# Patient Record
Sex: Female | Born: 1995 | Race: Black or African American | Hispanic: No | Marital: Single | Smoking: Former smoker
Health system: Southern US, Community
[De-identification: ages and names within clinical notes are randomized; demographics above are authoritative.]

## PROBLEM LIST (undated history)

## (undated) DIAGNOSIS — R4789 Other speech disturbances: Secondary | ICD-10-CM

## (undated) DIAGNOSIS — N390 Urinary tract infection, site not specified: Secondary | ICD-10-CM

## (undated) DIAGNOSIS — R519 Headache, unspecified: Secondary | ICD-10-CM

## (undated) HISTORY — DX: Urinary tract infection, site not specified: N39.0

## (undated) HISTORY — DX: Other speech disturbances: R47.89

## (undated) HISTORY — DX: Headache, unspecified: R51.9

---

## 2020-02-29 ENCOUNTER — Emergency Department (HOSPITAL_COMMUNITY)
Admission: EM | Admit: 2020-02-29 | Discharge: 2020-02-29 | Disposition: A | Discharge status: Home / Self Care | Payer: PRIVATE HEALTH INSURANCE | Attending: Emergency Medicine | Admitting: Emergency Medicine

## 2020-02-29 ENCOUNTER — Encounter (HOSPITAL_COMMUNITY): Payer: Self-pay

## 2020-02-29 ENCOUNTER — Other Ambulatory Visit: Payer: Self-pay

## 2020-02-29 ENCOUNTER — Emergency Department (HOSPITAL_COMMUNITY): Payer: PRIVATE HEALTH INSURANCE

## 2020-02-29 DIAGNOSIS — G8929 Other chronic pain: Secondary | ICD-10-CM | POA: Diagnosis not present

## 2020-02-29 DIAGNOSIS — R4701 Aphasia: Secondary | ICD-10-CM | POA: Diagnosis not present

## 2020-02-29 DIAGNOSIS — H539 Unspecified visual disturbance: Secondary | ICD-10-CM | POA: Diagnosis not present

## 2020-02-29 DIAGNOSIS — R479 Unspecified speech disturbances: Secondary | ICD-10-CM | POA: Diagnosis not present

## 2020-02-29 DIAGNOSIS — H9193 Unspecified hearing loss, bilateral: Secondary | ICD-10-CM | POA: Insufficient documentation

## 2020-02-29 DIAGNOSIS — R519 Headache, unspecified: Secondary | ICD-10-CM | POA: Diagnosis not present

## 2020-02-29 LAB — CBC
HCT: 39.7 % (ref 36.0–46.0)
Hemoglobin: 13.3 g/dL (ref 12.0–15.0)
MCH: 30.2 pg (ref 26.0–34.0)
MCHC: 33.5 g/dL (ref 30.0–36.0)
MCV: 90 fL (ref 80.0–100.0)
Platelets: 251 10*3/uL (ref 150–400)
RBC: 4.41 MIL/uL (ref 3.87–5.11)
RDW: 12.6 % (ref 11.5–15.5)
WBC: 12.2 10*3/uL — ABNORMAL HIGH (ref 4.0–10.5)
nRBC: 0 % (ref 0.0–0.2)

## 2020-02-29 LAB — BASIC METABOLIC PANEL
Anion gap: 10 (ref 5–15)
BUN: 10 mg/dL (ref 6–20)
CO2: 22 mmol/L (ref 22–32)
Calcium: 9 mg/dL (ref 8.9–10.3)
Chloride: 105 mmol/L (ref 98–111)
Creatinine, Ser: 0.86 mg/dL (ref 0.44–1.00)
GFR, Estimated: >60 mL/min (ref >60)
Glucose, Bld: 84 mg/dL (ref 70–99)
Potassium: 4 mmol/L (ref 3.5–5.1)
Sodium: 137 mmol/L (ref 135–145)

## 2020-02-29 LAB — I-STAT BETA HCG BLOOD, ED (MC, WL, AP ONLY): I-stat hCG, quantitative: <5 m[IU]/mL (ref <5)

## 2020-02-29 NOTE — ED Provider Notes (Signed)
[facility] EMERGENCY DEPARTMENT Provider Note   CSN: [id number] Arrival date & time: 02/29/20  1257     History Chief Complaint  Patient presents with  . Headache    [facility] Mary Sutton is a 24 y.o. female.  Patient with no pertinent past medical history, recently moved from [place] presents to the ED today for right sided HA and memory issues. She states that this began August 2020 and gotten worse recently, prompting ED visit today. She describes the HA pain as throbbing located primarily on the right side with it focused behind the right eye and moving towards the back of her head. She denies any lacrimation or rhinorrhea. She also states that she experiences "aphasia" which is described as being able to understand language and what she wants to say but has trouble word finding and expressing herself.  This has affected her ability to work.  She also states that she has noticed some grip weakness on her right side and also worsening vision changes and hearing loss on both sides. She did have a fall on Saturday in which she felt dizzy after working a long shift. She denies injury.  No treatments prior to arrival.        History reviewed. No pertinent past medical history.  There are no problems to display for this patient.   History reviewed. No pertinent surgical history.   OB History   No obstetric history on file.     History reviewed. No pertinent family history.  Social History   Tobacco Use  . Smoking status: Not on file  Substance Use Topics  . Alcohol use: Not on file  . Drug use: Not on file    Home Medications Prior to Admission medications   Not on File    Allergies    Patient has no allergy information on record.  Review of Systems   Review of Systems  Constitutional: Negative for fever.  HENT: Negative for congestion, dental problem, rhinorrhea and sinus pressure.   Eyes: Positive for visual disturbance (Blurry vision, no  vision loss). Negative for photophobia, discharge and redness.  Respiratory: Negative for shortness of breath.   Cardiovascular: Negative for chest pain.  Gastrointestinal: Negative for nausea and vomiting.  Musculoskeletal: Negative for gait problem, neck pain and neck stiffness.  Skin: Negative for rash.  Neurological: Positive for dizziness, speech difficulty, weakness, light-headedness and headaches. Negative for syncope and numbness.  Psychiatric/Behavioral: Negative for confusion.    Physical Exam Updated Vital Signs BP 116/69 (BP Location: Left Arm)   Pulse 76   Temp 98.4 F (36.9 C) (Oral)   Resp 18   Ht 5\' 5"  (1.651 m)   Wt 90.7 kg   LMP 01/31/2020 (Approximate)   SpO2 100%   BMI 33.28 kg/m   Physical Exam Vitals and nursing note reviewed.  Constitutional:      Appearance: She is well-developed.  HENT:     Head: Normocephalic and atraumatic.     Right Ear: Tympanic membrane, ear canal and external ear normal.     Left Ear: Tympanic membrane, ear canal and external ear normal.     Nose: Nose normal.     Mouth/Throat:     Pharynx: Uvula midline.  Eyes:     General: Lids are normal.     Extraocular Movements:     Right eye: No nystagmus.     Left eye: No nystagmus.     Conjunctiva/sclera: Conjunctivae normal.     Pupils:  Pupils are equal, round, and reactive to light.  Cardiovascular:     Rate and Rhythm: Normal rate and regular rhythm.  Pulmonary:     Effort: Pulmonary effort is normal.     Breath sounds: Normal breath sounds.  Abdominal:     Palpations: Abdomen is soft.     Tenderness: There is no abdominal tenderness.  Musculoskeletal:     Cervical back: Normal range of motion and neck supple. No tenderness or bony tenderness.  Skin:    General: Skin is warm and dry.  Neurological:     Mental Status: She is alert and oriented to person, place, and time.     GCS: GCS eye subscore is 4. GCS verbal subscore is 5. GCS motor subscore is 6.     Cranial  Nerves: No cranial nerve deficit or dysarthria.     Sensory: No sensory deficit.     Motor: Motor function is intact. No weakness.     Coordination: Coordination normal.     Gait: Gait normal.     Comments: 5/5 strength of the fingers, wrist, elbows, and shoulders bilaterally.  Able to talk in full sentences without any apparent word finding difficulties or trouble with her speech.     ED Results / Procedures / Treatments   Labs (all labs ordered are listed, but only abnormal results are displayed) Labs Reviewed  CBC - Abnormal; Notable for the following components:      Result Value   WBC 12.2 (*)    All other components within normal limits  BASIC METABOLIC PANEL  I-STAT BETA HCG BLOOD, ED ([facility], [facility], [facility] ONLY)    EKG None  Radiology CT Head Wo Contrast  Result Date: 02/29/2020 CLINICAL DATA:  Headache, new or worsening. EXAM: CT HEAD WITHOUT CONTRAST TECHNIQUE: Contiguous axial images were obtained from the base of the skull through the vertex without intravenous contrast. COMPARISON:  None. FINDINGS: Brain: No evidence of acute large vascular territory infarction, hemorrhage, hydrocephalus, extra-axial collection or mass lesion/mass effect. Vascular: No hyperdense vessel or unexpected calcification. Skull: Normal. Negative for fracture or focal lesion. Sinuses/Orbits: Opacification of a few left ethmoid air cells. Unremarkable orbits. Other: No mastoid effusions. IMPRESSION: No evidence of acute intracranial abnormality. Electronically Signed   By: Feliberto Harts MD   On: 02/29/2020 17:34    Procedures Procedures (including critical care time)  Medications Ordered in ED Medications - No data to display  ED Course  I have reviewed the triage vital signs and the nursing notes.  Pertinent labs & imaging results that were available during my care of the patient were reviewed by me and considered in my medical decision making (see chart for details).  Patient seen and  examined.  Lab work ordered by nursing protocol reviewed and unremarkable.  Given neurologic complaints, will obtain CT imaging of the head.  Vital signs reviewed and are as follows: BP 123/71 (BP Location: Left Arm)   Pulse 67   Temp 98.4 F (36.9 C) (Oral)   Resp 18   Ht 5\' 5"  (1.651 m)   Wt 90.7 kg   LMP 01/31/2020 (Approximate)   SpO2 98%   BMI 33.28 kg/m   6:11 PM patient updated on CT results.  No indication for further work-up in the emergency department, however patient is agreeable to follow-up as an outpatient with neurology.  Will place ambulatory referral.  Will also give referral for PCP as she just moved to [facility].  Patient counseled to return if  they have worsening symptoms including weakness in their arms or legs, slurred speech, trouble walking or talking, confusion, trouble with their balance, or if they have any other concerns. Patient verbalizes understanding and agrees with plan.      MDM Rules/Calculators/A&P                          Patient with chronic right-sided headache over the past 1 to 2 years with reported memory difficulties.  Also reports weakness in her right arm, vision and hearing changes.  Fortunately, tonight her exam is reassuring without any gross objective findings.  Patient without high-risk features of headache including: sudden onset/thunderclap HA, no similar headache in past, altered mental status, accompanying seizure, headache with exertion, age > 58, history of immunocompromise, neck or shoulder pain, fever, use of anticoagulation, family history of spontaneous SAH, concomitant drug use, toxic exposure.   Patient has a normal complete neurological exam, normal vital signs, normal level of consciousness, no signs of meningismus, is well-appearing/non-toxic appearing, no signs of trauma.   CT imaging of the head performed without any acute findings.  I do not feel that the patient requires MRI or other testing here in the emergency  department tonight, however she would benefit from outpatient neurology follow-up.  Referral placed.  No dangerous or life-threatening conditions suspected or identified by history, physical exam, and by work-up. No indications for hospitalization identified.   Final Clinical Impression(s) / ED Diagnoses Final diagnoses:  Chronic nonintractable headache, unspecified headache type  Difficulty with speech    Rx / DC Orders ED Discharge Orders         Ordered    Ambulatory referral to Neurology       Comments: An appointment is requested in approximately: 1 week Headaches, memory difficulty, subjective right arm weakness   02/29/20 1801           Renne Crigler, PA-C 02/29/20 1813    Sabino Donovan, MD 02/29/20 2219

## 2020-02-29 NOTE — Medical Student Note (Signed)
[facility]-EMERGENCY DEPT Provider Student Note For educational purposes for Medical, PA and NP students only and not part of the legal medical record.   CSN: [id number] Arrival date & time: 02/29/20  1257      History   Chief Complaint Chief Complaint  Patient presents with  . Headache    HPI Mary Sutton is a 24 y.o. female.  The history is provided by the patient.  Headache Pain location:  Frontal, R parietal, R temporal and occipital Associated symptoms: blurred vision, eye pain, focal weakness (right hand) and hearing loss   Associated symptoms: no abdominal pain, no diarrhea, no dizziness, no ear pain and no fever     Patient presents to the ED today for right sided HA and memory issues. She states that this all began August 2020 and gotten worse recently. She describes the HA pain as throbbing located primarily on the right sides with it focused behind the right head and moving towards the back of her head. She denies any lacrimation or rhinorrhea. She also states that she experiences aphasia which is described as expressive as she is able to understand language and what she wants to say but has trouble word finding and expressing herself. She also states that she has noticed some grip weakness on her right side and also worsening vision changes and hearing loss on both sides. She did have a fall on Saturday in which she felt dizzy after working a long shift. She denies and injury.   History reviewed. No pertinent past medical history.  There are no problems to display for this patient.   History reviewed. No pertinent surgical history.  OB History   No obstetric history on file.      Home Medications    Prior to Admission medications   Not on File    Family History History reviewed. No pertinent family history.  Social History Social History   Tobacco Use  . Smoking status: Not on file  Substance Use Topics  . Alcohol use: Not on file  . Drug use:  Not on file     Allergies   Patient has no allergy information on record.   Review of Systems Review of Systems  Constitutional: Negative for fever.  HENT: Positive for hearing loss. Negative for ear pain.   Eyes: Positive for blurred vision and pain.  Respiratory: Negative.   Cardiovascular: Negative.   Gastrointestinal: Negative for abdominal pain and diarrhea.  Endocrine: Negative.   Genitourinary: Negative.   Musculoskeletal: Negative.   Skin: Negative.   Allergic/Immunologic: Negative.   Neurological: Positive for focal weakness (right hand) and headaches. Negative for dizziness.  Hematological: Negative.   Psychiatric/Behavioral: Negative for suicidal ideas.     Physical Exam Updated Vital Signs BP 116/69 (BP Location: Left Arm)   Pulse 76   Temp 98.4 F (36.9 C) (Oral)   Resp 18   Ht 5\' 5"  (1.651 m)   Wt 90.7 kg   LMP 01/31/2020 (Approximate)   SpO2 100%   BMI 33.28 kg/m   Physical Exam Constitutional:      Appearance: She is well-developed.  HENT:     Head: Normocephalic and atraumatic.      Mouth/Throat:     Mouth: Mucous membranes are moist.     Pharynx: Oropharynx is clear.  Eyes:     Extraocular Movements: Extraocular movements intact.     Pupils: Pupils are equal, round, and reactive to light.  Pulmonary:     Effort:  Pulmonary effort is normal.     Breath sounds: Normal breath sounds.  Musculoskeletal:     Cervical back: Normal range of motion and neck supple.  Neurological:     Mental Status: She is alert.    Physical exam was normal. She demonstrated a normal neuro exam. She had normal strength and sensation to both upper and lower extremities bilaterally.  ED Treatments / Results  Labs (all labs ordered are listed, but only abnormal results are displayed) Labs Reviewed  CBC - Abnormal; Notable for the following components:      Result Value   WBC 12.2 (*)    All other components within normal limits  BASIC METABOLIC PANEL    I-STAT BETA HCG BLOOD, ED ([facility], [facility], [facility] ONLY)    EKG  Radiology Non contrast head CT was ordered and the radiologist read it as normal  Procedures Procedures (including critical care time)  Medications Ordered in ED Medications - No data to display   Initial Impression / Assessment and Plan / ED Course  I have reviewed the triage vital signs and the nursing notes.  Pertinent labs & imaging results that were available during my care of the patient were reviewed by me and considered in my medical decision making (see chart for details).     Based on the results of the CT, lab results, and physical exam I think that this patient can be discharged today with plans of having her follow up with a neurologist. She will be given NSAIDs for pain from her headache. She has been instructed to come back if she develops any acute changes such as worsening HA, weakness or numbness, changes or vision.   Final Clinical Impressions(s) / ED Diagnoses     New Prescriptions New Prescriptions   No medications on file

## 2020-02-29 NOTE — ED Triage Notes (Signed)
Pt to ED c/o headache over the past year, Reports she has been having issues with short term and long term memory at her job. Reports sensitivity to right eye. Reports the pain is constant and is pressure. No medical history. Reports takes ibprofen with no relief. Pt crying in triage, no speech clear.

## 2020-02-29 NOTE — Discharge Instructions (Signed)
Please read and follow all provided instructions.  Your diagnoses today include:  1. Chronic nonintractable headache, unspecified headache type   2. Difficulty with speech     Tests performed today include:  CT of your head which was normal and did not show any serious cause of your headache  Vital signs. See below for your results today.   Medications:   None  Take any prescribed medications only as directed.  Additional information:  Follow any educational materials contained in this packet.  You are having a headache. No specific cause was found today for your headache. It may have been a migraine or other cause of headache. Stress, anxiety, fatigue, and depression are common triggers for headaches.   Your headache today does not appear to be life-threatening or require hospitalization, but often the exact cause of headaches is not determined in the emergency department. Therefore, follow-up with your doctor is very important to find out what may have caused your headache and whether or not you need any further diagnostic testing or treatment.   Sometimes headaches can appear benign (not harmful), but then more serious symptoms can develop which should prompt an immediate re-evaluation by your doctor or the emergency department.  BE VERY CAREFUL not to take multiple medicines containing Tylenol (also called acetaminophen). Doing so can lead to an overdose which can damage your liver and cause liver failure and possibly death.    Follow-up instructions: Please follow-up with the neurologist listed for further evaluation of your symptoms.   Return instructions:   Please return to the Emergency Department if you experience worsening symptoms.  Return if the medications do not resolve your headache, if it recurs, or if you have multiple episodes of vomiting or cannot keep down fluids.  Return if you have a change from the usual headache.  RETURN IMMEDIATELY IF you:  Develop  a sudden, severe headache  Develop confusion or become poorly responsive or faint  Develop a fever above 100.43F or problem breathing  Have a change in speech, vision, swallowing, or understanding  Develop new weakness, numbness, tingling, incoordination in your arms or legs  Have a seizure  Please return if you have any other emergent concerns.  Additional Information:  Your vital signs today were: BP 116/69 (BP Location: Left Arm)   Pulse 76   Temp 98.4 F (36.9 C) (Oral)   Resp 18   Ht 5\' 5"  (1.651 m)   Wt 90.7 kg   LMP 01/31/2020 (Approximate)   SpO2 100%   BMI 33.28 kg/m  If your blood pressure (BP) was elevated above 135/85 this visit, please have this repeated by your doctor within one month. --------------

## 2020-02-29 NOTE — ED Notes (Signed)
Patient transported to CT 

## 2020-06-19 ENCOUNTER — Encounter: Payer: Self-pay | Admitting: *Deleted

## 2020-06-20 ENCOUNTER — Ambulatory Visit (INDEPENDENT_AMBULATORY_CARE_PROVIDER_SITE_OTHER): Payer: 59 | Admitting: Diagnostic Neuroimaging

## 2020-06-20 ENCOUNTER — Encounter: Payer: Self-pay | Admitting: Diagnostic Neuroimaging

## 2020-06-20 ENCOUNTER — Other Ambulatory Visit: Payer: Self-pay

## 2020-06-20 VITALS — BP 128/80 | HR 88 | Ht 65.0 in | Wt 195.0 lb

## 2020-06-20 DIAGNOSIS — R413 Other amnesia: Secondary | ICD-10-CM | POA: Diagnosis not present

## 2020-06-20 DIAGNOSIS — R519 Headache, unspecified: Secondary | ICD-10-CM

## 2020-06-20 NOTE — Patient Instructions (Signed)
  RIGHT SIDED HEADACHES / MEMORY LOSS - check MRI brain - daily physical activity / exercise (at least 15-30 minutes) - eat more plants / vegetables - increase social activities, brain stimulation, games, puzzles, hobbies, crafts, arts, music - aim for at least 7-8 hours sleep per night (or more)   H/O MIGRAINE WITH AURA - consider amitriptyline, topiramate, triptans

## 2020-06-20 NOTE — Progress Notes (Signed)
[facility]  PATIENT: Mary Sutton DOB: 08-Nov-1995  REFERRING CLINICIAN: Renne Crigler, PA-C HISTORY FROM: patient  REASON FOR VISIT: new consult    HISTORICAL  CHIEF COMPLAINT:  Chief Complaint  Patient presents with  . Headache    Rm 7 New Pt, ED referral "migraines since I was younger, got better but have come back, past 2 weeks throbbing, headaches across my forehead w/short term memory loss/stuttering, can't remember simple words; Ibuprofen can help after several doses, used to alternate Excedrin and Ibuprofen"     HISTORY OF PRESENT ILLNESS:   25 year old female here for evaluation of headaches and memory loss.  Patient reports history of migraine headaches from age 51 to 31 years old with right-sided headaches, nausea, controlled with over-the-counter medications.  Headache spontaneously resolved.  About 2 years ago she had onset of mixing up her letters and words, memory loss, confusion, right-sided headaches, on a daily basis.  She has constant nausea.  No sensitive light or sound.  She has had significant increase stress lately, was tearful in the exam room but would not elaborate further.  She was previously living in [place], but moved to [place] in July 2021 and currently lives with her best friend.  She works 2 jobs, 8:30a-4:30p and 5:00p-11:00p.  She states she has been working this type of schedule since age 43 years old.  Due to severity of symptoms patient went to the emergency room recently for evaluation.  CT of the head was unremarkable.  Patient was given migraine cocktail medications and discharged.     REVIEW OF SYSTEMS: Full 14 system review of systems performed and negative with exception of: As per HPI.  ALLERGIES: No Known Allergies  HOME MEDICATIONS: No outpatient medications prior to visit.   No facility-administered medications prior to visit.    PAST MEDICAL HISTORY: Past Medical History:  Diagnosis Date   . Chronic UTI   . Headache   . Word finding difficulty     PAST SURGICAL HISTORY: No past surgical history on file.  FAMILY HISTORY: No family history on file.  SOCIAL HISTORY: Social History   Socioeconomic History  . Marital status: Single    Spouse name: Not on file  . Number of children: 0  . Years of education: Not on file  . Highest education level: Some college, no degree  Occupational History    Comment: customer service, work 2 jobs  Tobacco Use  . Smoking status: Former Smoker    Quit date: 06/21/2018    Years since quitting: 2.0  . Smokeless tobacco: Never Used  . Tobacco comment: 1 cig a day, socially  Substance and Sexual Activity  . Alcohol use: Yes    Comment: socially  . 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 Sexual activity: Not on file  Other Topics Concern  . Not on file  Social History Narrative   Caffeine- none   Social Determinants of Health   Financial Resource Strain: Not on file  Food Insecurity: Not on file  Transportation Needs: Not on file  Physical Activity: Not on file  Stress: Not on file  Social Connections: Not on file  Intimate Partner Violence: Not on file     PHYSICAL EXAM  GENERAL EXAM/CONSTITUTIONAL: Vitals:  Vitals:   06/20/20 1541  BP: 128/80  Pulse: 88  Weight: 195 lb (88.5 kg)  Height: 5\' 5"  (1.651 m)   Body mass index is  32.45 kg/m. Wt Readings from Last 3 Encounters:  06/20/20 195 lb (88.5 kg)  02/29/20 200 lb (90.7 kg)    Patient is in no distress; well developed, nourished and groomed; neck is supple  CARDIOVASCULAR:  Examination of carotid arteries is normal; no carotid bruits  Regular rate and rhythm, no murmurs  Examination of peripheral vascular system by observation and palpation is normal  EYES:  Ophthalmoscopic exam of optic discs and posterior segments is normal; no papilledema or hemorrhages No exam data present  MUSCULOSKELETAL:  Gait, strength, tone,  movements noted in Neurologic exam below  NEUROLOGIC: MENTAL STATUS:  No flowsheet data found.  awake, alert, oriented to person, place and time  recent and remote memory intact  normal attention and concentration  language fluent, comprehension intact, naming intact  fund of knowledge appropriate  CRANIAL NERVE:   2nd - no papilledema on fundoscopic exam  2nd, 3rd, 4th, 6th - pupils equal and reactive to light, visual fields full to confrontation, extraocular muscles intact, no nystagmus  5th - facial sensation symmetric  7th - facial strength symmetric  8th - hearing intact  9th - palate elevates symmetrically, uvula midline  11th - shoulder shrug symmetric  12th - tongue protrusion midline  MOTOR:   normal bulk and tone, full strength in the BUE, BLE  SENSORY:   normal and symmetric to light touch, temperature, vibration  COORDINATION:   finger-nose-finger, fine finger movements normal  REFLEXES:   deep tendon reflexes present and symmetric  GAIT/STATION:   narrow based gait     DIAGNOSTIC DATA (LABS, IMAGING, TESTING) - I reviewed patient records, labs, notes, testing and imaging myself where available.  Lab Results  Component Value Date   WBC 12.2 (H) 02/29/2020   HGB 13.3 02/29/2020   HCT 39.7 02/29/2020   MCV 90.0 02/29/2020   PLT 251 02/29/2020      Component Value Date/Time   NA 137 02/29/2020 1327   K 4.0 02/29/2020 1327   CL 105 02/29/2020 1327   CO2 22 02/29/2020 1327   GLUCOSE 84 02/29/2020 1327   BUN 10 02/29/2020 1327   CREATININE 0.86 02/29/2020 1327   CALCIUM 9.0 02/29/2020 1327   GFRNONAA >60 02/29/2020 1327   No results found for: CHOL, HDL, LDLCALC, LDLDIRECT, TRIG, CHOLHDL No results found for: UEKC0K No results found for: VITAMINB12 No results found for: TSH  02/29/20 CT head [I reviewed images myself and agree with interpretation. -VRP]  - negative   ASSESSMENT AND PLAN  25 y.o. year old female here  with:  Dx:  1. Right sided temporal headache   2. Memory loss      PLAN:  RIGHT SIDED HEADACHES / MEMORY LOSS - check MRI brain - daily physical activity / exercise (at least 15-30 minutes) - eat more plants / vegetables - increase social activities, brain stimulation, games, puzzles, hobbies, crafts, arts, music - aim for at least 7-8 hours sleep per night (or more)  H/O MIGRAINE WITH AURA - consider amitriptyline, topiramate, triptans  Orders Placed This Encounter  Procedures  . MR BRAIN W WO CONTRAST   Return for pending if symptoms worsen or fail to improve.    Suanne Marker, MD 06/20/2020, 4:15 PM Certified in Neurology, Neurophysiology and Neuroimaging  [facility] [address] [place], [place] [address] [phone]

## 2020-06-21 ENCOUNTER — Telehealth: Payer: Self-pay | Admitting: Diagnostic Neuroimaging

## 2020-06-21 NOTE — Telephone Encounter (Signed)
[employer] pending faxed notes 

## 2020-06-26 ENCOUNTER — Telehealth: Payer: Self-pay | Admitting: Diagnostic Neuroimaging

## 2020-06-26 NOTE — Telephone Encounter (Signed)
Pt. states that her insurance approved PA for MRI & she wants to know when she will be scheduled. Please advise.

## 2020-06-26 NOTE — Telephone Encounter (Signed)
Called patient and informed her she can call [facility] to schedule MRI, gave her their #. Patient verbalized understanding, appreciation.

## 2020-06-26 NOTE — Telephone Encounter (Signed)
[employer] Berkley Harvey: [id number] (exp. 06/25/20 to 08/09/20) order sent to GI. They will reach out to the patient to schedule.

## 2020-07-06 ENCOUNTER — Ambulatory Visit
Admission: RE | Admit: 2020-07-06 | Discharge: 2020-07-06 | Disposition: A | Discharge status: Home / Self Care | Payer: 59 | Source: Ambulatory Visit | Attending: Diagnostic Neuroimaging | Admitting: Diagnostic Neuroimaging

## 2020-07-06 ENCOUNTER — Other Ambulatory Visit: Payer: Self-pay

## 2020-07-06 DIAGNOSIS — R519 Headache, unspecified: Secondary | ICD-10-CM

## 2020-07-06 DIAGNOSIS — R413 Other amnesia: Secondary | ICD-10-CM

## 2020-07-06 MED ORDER — GADOBENATE DIMEGLUMINE 529 MG/ML IV SOLN
18.0000 mL | Freq: Once | INTRAVENOUS | Status: AC | PRN
  Administered 2020-07-06: 8 mL via INTRAVENOUS

## 2020-07-10 ENCOUNTER — Telehealth: Payer: Self-pay

## 2020-07-10 ENCOUNTER — Other Ambulatory Visit: Payer: Self-pay | Admitting: Physician Assistant

## 2020-07-10 DIAGNOSIS — R1031 Right lower quadrant pain: Secondary | ICD-10-CM

## 2020-07-10 NOTE — Telephone Encounter (Signed)
Mary Marker, MD  07/06/2020 4:58 PM EDT Back to Top     Unremarkable imaging results. Please call patient. Continue current plan. -VRP    I called pt and we reviewed results. Pt verbalized understanding and requested to f/u in 3 months. Appt scheduled for June 27 th at 430 pm

## 2020-07-17 ENCOUNTER — Other Ambulatory Visit: Payer: Self-pay

## 2020-07-25 ENCOUNTER — Other Ambulatory Visit: Payer: 59

## 2020-09-20 ENCOUNTER — Encounter (HOSPITAL_COMMUNITY): Payer: Self-pay | Admitting: Registered Nurse

## 2020-09-20 ENCOUNTER — Ambulatory Visit (HOSPITAL_COMMUNITY)
Admission: EM | Admit: 2020-09-20 | Discharge: 2020-09-21 | Disposition: A | Discharge status: Home / Self Care | Payer: No Payment, Other | Attending: Psychiatry | Admitting: Psychiatry

## 2020-09-20 ENCOUNTER — Other Ambulatory Visit: Payer: Self-pay

## 2020-09-20 DIAGNOSIS — Z20822 Contact with and (suspected) exposure to covid-19: Secondary | ICD-10-CM | POA: Diagnosis not present

## 2020-09-20 DIAGNOSIS — F411 Generalized anxiety disorder: Secondary | ICD-10-CM | POA: Diagnosis not present

## 2020-09-20 DIAGNOSIS — F1290 Cannabis use, unspecified, uncomplicated: Secondary | ICD-10-CM | POA: Insufficient documentation

## 2020-09-20 DIAGNOSIS — Z79899 Other long term (current) drug therapy: Secondary | ICD-10-CM | POA: Insufficient documentation

## 2020-09-20 DIAGNOSIS — F17210 Nicotine dependence, cigarettes, uncomplicated: Secondary | ICD-10-CM | POA: Insufficient documentation

## 2020-09-20 DIAGNOSIS — F332 Major depressive disorder, recurrent severe without psychotic features: Secondary | ICD-10-CM | POA: Insufficient documentation

## 2020-09-20 DIAGNOSIS — R45851 Suicidal ideations: Secondary | ICD-10-CM | POA: Diagnosis not present

## 2020-09-20 LAB — POCT URINE DRUG SCREEN - MANUAL ENTRY (I-SCREEN)
POC Amphetamine UR: NOT DETECTED
POC Buprenorphine (BUP): NOT DETECTED
POC Cocaine UR: NOT DETECTED
POC Marijuana UR: POSITIVE — AB
POC Methadone UR: NOT DETECTED
POC Methamphetamine UR: NOT DETECTED
POC Morphine: NOT DETECTED
POC Oxazepam (BZO): NOT DETECTED
POC Oxycodone UR: NOT DETECTED
POC Secobarbital (BAR): NOT DETECTED

## 2020-09-20 LAB — RESP PANEL BY RT-PCR (FLU A&B, COVID) ARPGX2
Influenza A by PCR: NEGATIVE
Influenza B by PCR: NEGATIVE
SARS Coronavirus 2 by RT PCR: NEGATIVE

## 2020-09-20 LAB — POCT PREGNANCY, URINE: Preg Test, Ur: NEGATIVE

## 2020-09-20 LAB — POC SARS CORONAVIRUS 2 AG: SARSCOV2ONAVIRUS 2 AG: NEGATIVE

## 2020-09-20 MED ORDER — METRONIDAZOLE 250 MG PO TABS
500.0000 mg | ORAL_TABLET | Freq: Two times a day (BID) | ORAL | Status: DC
  Administered 2020-09-21 (×2): 500 mg via ORAL
  Filled 2020-09-20 (×2): qty 2

## 2020-09-20 MED ORDER — MAGNESIUM HYDROXIDE 400 MG/5ML PO SUSP: 30.0000 mL | Freq: Every day | ORAL | Status: DC | PRN

## 2020-09-20 MED ORDER — QUETIAPINE FUMARATE 50 MG PO TABS
50.0000 mg | ORAL_TABLET | Freq: Every day | ORAL | Status: DC
  Administered 2020-09-20: 21:00:00 50 mg via ORAL
  Filled 2020-09-20: qty 1

## 2020-09-20 MED ORDER — ALUM & MAG HYDROXIDE-SIMETH 200-200-20 MG/5ML PO SUSP: 30.0000 mL | ORAL | Status: DC | PRN

## 2020-09-20 MED ORDER — ACETAMINOPHEN 325 MG PO TABS: 650.0000 mg | ORAL_TABLET | Freq: Four times a day (QID) | ORAL | Status: DC | PRN

## 2020-09-20 NOTE — ED Notes (Signed)
Pt asleep in bed. Respirations even and unlabored. Will continue to monitor for safety. ?

## 2020-09-20 NOTE — ED Provider Notes (Signed)
Notified by nursing staff that patient was requesting Flagyl.  Patient seen and examined by myself.  Patient states that she was diagnosed with BV recently and is currently on day 6 of 7 of her Flagyl BID treatment.  Patient states that she took her last dose of Flagyl in the morning on 09/20/2020 prior to her coming to [facility]  Patient endorses having brown, thick, foul-smelling vaginal discharge.  Patient endorses dysuria as well.  Patient denies any hematuria, urinary incontinence, urinary frequency, urinary urgency, or any additional GU symptoms.  Patient denies any fever, chills, headache, lightheadedness, dizziness, or any additional physical symptoms at this time.  Patient states that she is on day 6 of 7 of her Flagyl BID treatment and took 1 dose already for day 6 on 09/20/2020. Order placed for patient's 3 remaining doses of Flagyl 500 mg p.o. twice daily for BV.  Will check UA for potential UTI.  UA results pending at this time.  Recommend that dayshift treatment team review patient's UA results and initiate antibiotic treatment if patient's UA shows UTI once results are back.
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
## 2020-09-20 NOTE — Progress Notes (Signed)
Patient admitted the [facility] for observation, due to SI, unable to contract for safety at home. Patient oriented to the unit, food and beverages offered.

## 2020-09-20 NOTE — ED Notes (Addendum)
Pt asking if she can have her Metronidazole, stating she has "BV" and is "on day 6 out of 7." [place] Abts, [place] notified and will speak to pt regarding her symptoms/medication.

## 2020-09-21 LAB — TSH: TSH: 3.476 u[IU]/mL (ref 0.350–4.500)

## 2020-09-21 LAB — COMPREHENSIVE METABOLIC PANEL
ALT: 15 U/L (ref 0–44)
AST: 18 U/L (ref 15–41)
Albumin: 4.6 g/dL (ref 3.5–5.0)
Alkaline Phosphatase: 76 U/L (ref 38–126)
Anion gap: 12 (ref 5–15)
BUN: 7 mg/dL (ref 6–20)
CO2: 24 mmol/L (ref 22–32)
Calcium: 9.9 mg/dL (ref 8.9–10.3)
Chloride: 101 mmol/L (ref 98–111)
Creatinine, Ser: 0.96 mg/dL (ref 0.44–1.00)
GFR, Estimated: >60 mL/min (ref >60)
Glucose, Bld: 75 mg/dL (ref 70–99)
Potassium: 4.5 mmol/L (ref 3.5–5.1)
Sodium: 137 mmol/L (ref 135–145)
Total Bilirubin: 0.7 mg/dL (ref 0.3–1.2)
Total Protein: 8 g/dL (ref 6.5–8.1)

## 2020-09-21 LAB — CBC WITH DIFFERENTIAL/PLATELET
Abs Immature Granulocytes: 0.04 10*3/uL (ref 0.00–0.07)
Basophils Absolute: 0 10*3/uL (ref 0.0–0.1)
Basophils Relative: 0 %
Eosinophils Absolute: 0 10*3/uL (ref 0.0–0.5)
Eosinophils Relative: 0 %
HCT: 44.4 % (ref 36.0–46.0)
Hemoglobin: 15.3 g/dL — ABNORMAL HIGH (ref 12.0–15.0)
Immature Granulocytes: 0 %
Lymphocytes Relative: 23 %
Lymphs Abs: 3.2 10*3/uL (ref 0.7–4.0)
MCH: 30.5 pg (ref 26.0–34.0)
MCHC: 34.5 g/dL (ref 30.0–36.0)
MCV: 88.4 fL (ref 80.0–100.0)
Monocytes Absolute: 0.6 10*3/uL (ref 0.1–1.0)
Monocytes Relative: 4 %
Neutro Abs: 9.8 10*3/uL — ABNORMAL HIGH (ref 1.7–7.7)
Neutrophils Relative %: 73 %
Platelets: 267 10*3/uL (ref 150–400)
RBC: 5.02 MIL/uL (ref 3.87–5.11)
RDW: 12.3 % (ref 11.5–15.5)
WBC: 13.8 10*3/uL — ABNORMAL HIGH (ref 4.0–10.5)
nRBC: 0 % (ref 0.0–0.2)

## 2020-09-21 LAB — URINALYSIS, ROUTINE W REFLEX MICROSCOPIC
Bilirubin Urine: NEGATIVE
Glucose, UA: NEGATIVE mg/dL
Ketones, ur: 20 mg/dL — AB
Leukocytes,Ua: NEGATIVE
Nitrite: NEGATIVE
Protein, ur: 30 mg/dL — AB
Specific Gravity, Urine: 1.03 (ref 1.005–1.030)
pH: 5 (ref 5.0–8.0)

## 2020-09-21 LAB — ETHANOL: Alcohol, Ethyl (B): <10 mg/dL (ref <10)

## 2020-09-21 LAB — LIPID PANEL
Cholesterol: 157 mg/dL (ref 0–200)
HDL: 66 mg/dL (ref >40)
LDL Cholesterol: 83 mg/dL (ref 0–99)
Total CHOL/HDL Ratio: 2.4 RATIO
Triglycerides: 41 mg/dL (ref <150)
VLDL: 8 mg/dL (ref 0–40)

## 2020-09-21 LAB — PREGNANCY, URINE: Preg Test, Ur: NEGATIVE

## 2020-09-21 LAB — MAGNESIUM: Magnesium: 2.1 mg/dL (ref 1.7–2.4)

## 2020-09-21 MED ORDER — QUETIAPINE FUMARATE 25 MG PO TABS
25.0000 mg | ORAL_TABLET | Freq: Every day | ORAL | Status: DC
  Filled 2020-09-21: qty 7

## 2020-09-21 MED ORDER — QUETIAPINE FUMARATE 25 MG PO TABS: 25.0000 mg | ORAL_TABLET | Freq: Every day | ORAL | 0 refills | Status: DC

## 2020-09-21 NOTE — ED Notes (Signed)
Pt asleep in bed. Respirations even and unlabored. Will continue to monitor for safety. ?

## 2020-09-21 NOTE — Progress Notes (Signed)
Pt is alert and oriented. Pt is presently resting quietly. Administered medication with no incident. Pt denies pain, SI/HI/AVH at this time. Staff will monitor for safety.
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
## 2020-09-21 NOTE — Discharge Instructions (Addendum)

## 2020-09-21 NOTE — Discharge Summary (Signed)
[facility] to be D/C'd Home per MD order. Discussed with the patient and all questions fully answered. An After Visit Summary was printed and given to the patient. Medication sample was also given to patient. Patient escorted out and D/C home via private auto.  Dickie La  09/21/2020 10:33 AM

## 2020-09-22 LAB — HEMOGLOBIN A1C
Hgb A1c MFr Bld: 5.3 % (ref 4.8–5.6)
Mean Plasma Glucose: 105 mg/dL

## 2020-09-26 ENCOUNTER — Ambulatory Visit (INDEPENDENT_AMBULATORY_CARE_PROVIDER_SITE_OTHER): Payer: No Payment, Other | Admitting: Clinical

## 2020-09-26 ENCOUNTER — Other Ambulatory Visit: Payer: Self-pay

## 2020-09-26 DIAGNOSIS — F332 Major depressive disorder, recurrent severe without psychotic features: Secondary | ICD-10-CM

## 2020-09-26 NOTE — Progress Notes (Signed)
THERAPIST PROGRESS NOTE  Session Time: 36 minutes  Participation Level: Active  Behavioral Response: CasualAlertDepressed  Type of Therapy: Individual Therapy  Treatment Goals addressed: Coping  Interventions: CBT and Supportive  Summary:  Mary Sutton is a 25 y.o. female who presents for the scheduled session oriented times five, appropriately dressed, and cooperative. Client denied hallucinations and delusions.  Client presents for her initial appointment at [facility] for outpatient therapy services. Client presents following discharge from [facility] urgent care on 09/20/2020. Per review of clients MR she was treated for severe major depression and anxiety. Client reported a history of depression and anxiety that began during her childhood. Client reported in 2021 she moved from [place] to [place] with a friend. Client reported she has no relationship with her parents, and she talks to her brothers sometimes. Client reported she has managed to deal with her depression on her own until recently. Client reported over the past year she feels like her life has been on auto pilot and doesn't know what to do. Client reported she feels like things from her past are catching up to her. Client declined going into detail about past events but confirmed trauma when she was younger. Client reported she has the following symptoms of crying spells, insomnia, poor appetite, depressed mood, and racing thoughts. Client reported she has been experiencing extreme memory loss and think it pertains to her depression. Client reported when she was younger taking sleeping medication to "help her escape" her emotions and/or reality. Client reported upon discharge from [facility] urgent care she was prescribed Seroquel but has decided to discontinue use. Client reported he did not how it made her feel sedated. Client reported she decided she would like to work on coping skills in therapy to help her manage her depression  opposed to using medication. Client reported she previously took sleeping medication to "escape her life" so she does not want to take psychotropic medication because it masks her emotions. Client reported no prior hospitalization or outpatient treatment for mental health reasons. Client reported history of daily marijuana but stopped use since her discharge from [facility] urgent care. Client reported she is not having suicidal ideations at this time.  Client was screened for the following SDOH:  GAD 7 : Generalized Anxiety Score 09/26/2020  Nervous, Anxious, on Edge 3  Control/stop worrying 3  Worry too much - different things 3  Trouble relaxing 3  Restless 3  Easily annoyed or irritable 1  Afraid - awful might happen 2  Total GAD 7 Score 18  Anxiety Difficulty Extremely difficult     Flowsheet Row Counselor from 09/26/2020 in [facility]  PHQ-9 Total Score 21         Suicidal/Homicidal: Nowithout intent/plan  Therapist Response:  Therapist began the session making introduction and discussing confidentiality. Therapist used CBT to engage with the client and ask open ended questions about the assessment and presenting problem at time of observation at [facility] urgent care. Therapist used CBT to ask open ended questions about medication compliance and ongoing symptoms. Therapist used CBT to ask open ended questions about suicide ideation severity and discussed safety planning with available resource in case of crisis.  Therapist used CBT to ask the client about her therapy goals. Client was scheduled for next available appointment.   Plan: Return again in 5 weeks for individual therapy. Client will be scheduled for a sooner therapy appointment when a cancellation is available. Client declined to continue with  psychiatric evaluation and medication management.     Diagnosis: Major depressive disorder, recurrent episode, severe, without psychosis   Mary Sutton  Mary Hoch, LCSW 09/26/2020

## 2020-10-05 ENCOUNTER — Ambulatory Visit: Payer: Medicaid Other | Admitting: Physician Assistant

## 2020-10-05 ENCOUNTER — Other Ambulatory Visit: Payer: Self-pay

## 2020-10-05 ENCOUNTER — Other Ambulatory Visit (HOSPITAL_COMMUNITY)
Admission: RE | Admit: 2020-10-05 | Discharge: 2020-10-05 | Disposition: A | Discharge status: Home / Self Care | Payer: Medicaid Other | Source: Ambulatory Visit | Attending: Physician Assistant | Admitting: Physician Assistant

## 2020-10-05 VITALS — BP 139/75 | HR 82 | Temp 98.2°F | Resp 18 | Ht 65.0 in | Wt 177.0 lb

## 2020-10-05 DIAGNOSIS — F411 Generalized anxiety disorder: Secondary | ICD-10-CM

## 2020-10-05 DIAGNOSIS — Z113 Encounter for screening for infections with a predominantly sexual mode of transmission: Secondary | ICD-10-CM | POA: Diagnosis not present

## 2020-10-05 DIAGNOSIS — R319 Hematuria, unspecified: Secondary | ICD-10-CM | POA: Insufficient documentation

## 2020-10-05 DIAGNOSIS — N898 Other specified noninflammatory disorders of vagina: Secondary | ICD-10-CM | POA: Insufficient documentation

## 2020-10-05 DIAGNOSIS — F332 Major depressive disorder, recurrent severe without psychotic features: Secondary | ICD-10-CM

## 2020-10-05 DIAGNOSIS — E559 Vitamin D deficiency, unspecified: Secondary | ICD-10-CM

## 2020-10-05 DIAGNOSIS — F5104 Psychophysiologic insomnia: Secondary | ICD-10-CM

## 2020-10-05 LAB — POCT URINALYSIS DIP (CLINITEK)
Bilirubin, UA: NEGATIVE
Glucose, UA: NEGATIVE mg/dL
Ketones, POC UA: NEGATIVE mg/dL
Leukocytes, UA: NEGATIVE
Nitrite, UA: NEGATIVE
POC PROTEIN,UA: 30 — AB
Spec Grav, UA: >=1.03 — AB (ref 1.010–1.025)
Urobilinogen, UA: 0.2 E.U./dL
pH, UA: 6 (ref 5.0–8.0)

## 2020-10-05 NOTE — Progress Notes (Signed)
Patient presents with hematuria and clots since her cycle 09/09/20. Patient has had BV and recurring UTI's since. Patient has not taken medication today. Patient has eaten today. Patient reports night sweats for the past 2 months

## 2020-10-05 NOTE — Patient Instructions (Signed)
We will call you with your lab results.  I do encourage you to increase your hydration, I do strongly encourage you to work on improving your sleep.  Roney Jaffe, PA-C Physician Assistant [facility] [email or web address]   Insomnia Insomnia is a sleep disorder that makes it difficult to fall asleep or stay asleep. Insomnia can cause fatigue, low energy, difficulty concentrating, moodswings, and poor performance at work or school. There are three different ways to classify insomnia: Difficulty falling asleep. Difficulty staying asleep. Waking up too early in the morning. Any type of insomnia can be long-term (chronic) or short-term (acute). Both are common. Short-term insomnia usually lasts for three months or less. Chronic insomnia occurs at least three times a week for longer than threemonths. What are the causes? Insomnia may be caused by another condition, situation, or substance, such as: Anxiety. Certain medicines. Gastroesophageal reflux disease (GERD) or other gastrointestinal conditions. Asthma or other breathing conditions. Restless legs syndrome, sleep apnea, or other sleep disorders. Chronic pain. Menopause. Stroke. Abuse of alcohol, tobacco, or illegal drugs. Mental health conditions, such as depression. Caffeine. Neurological disorders, such as Alzheimer's disease. An overactive thyroid (hyperthyroidism). Sometimes, the cause of insomnia may not be known. What increases the risk? Risk factors for insomnia include: Gender. Women are affected more often than men. Age. Insomnia is more common as you get older. Stress. Lack of exercise. Irregular work schedule or working night shifts. Traveling between different time zones. Certain medical and mental health conditions. What are the signs or symptoms? If you have insomnia, the main symptom is having trouble falling asleep or having trouble staying asleep. This  may lead to other symptoms, such as: Feeling fatigued or having low energy. Feeling nervous about going to sleep. Not feeling rested in the morning. Having trouble concentrating. Feeling irritable, anxious, or depressed. How is this diagnosed? This condition may be diagnosed based on: Your symptoms and medical history. Your health care provider may ask about: Your sleep habits. Any medical conditions you have. Your mental health. A physical exam. How is this treated? Treatment for insomnia depends on the cause. Treatment may focus on treating an underlying condition that is causing insomnia. Treatment may also include: Medicines to help you sleep. Counseling or therapy. Lifestyle adjustments to help you sleep better. Follow these instructions at home: Eating and drinking  Limit or avoid alcohol, caffeinated beverages, and cigarettes, especially close to bedtime. These can disrupt your sleep. Do not eat a large meal or eat spicy foods right before bedtime. This can lead to digestive discomfort that can make it hard for you to sleep.  Sleep habits  Keep a sleep diary to help you and your health care provider figure out what could be causing your insomnia. Write down: When you sleep. When you wake up during the night. How well you sleep. How rested you feel the next day. Any side effects of medicines you are taking. What you eat and drink. Make your bedroom a dark, comfortable place where it is easy to fall asleep. Put up shades or blackout curtains to block light from outside. Use a white noise machine to block noise. Keep the temperature cool. Limit screen use before bedtime. This includes: Watching TV. Using your smartphone, tablet, or computer. Stick to a routine that includes going to bed and waking up at the same times every day and night. This can help you fall asleep faster. Consider making a quiet activity, such as reading, part of your nighttime routine.  Try to avoid  taking naps during the day so that you sleep better at night. Get out of bed if you are still awake after 15 minutes of trying to sleep. Keep the lights down, but try reading or doing a quiet activity. When you feel sleepy, go back to bed.  General instructions Take over-the-counter and prescription medicines only as told by your health care provider. Exercise regularly, as told by your health care provider. Avoid exercise starting several hours before bedtime. Use relaxation techniques to manage stress. Ask your health care provider to suggest some techniques that may work well for you. These may include: Breathing exercises. Routines to release muscle tension. Visualizing peaceful scenes. Make sure that you drive carefully. Avoid driving if you feel very sleepy. Keep all follow-up visits as told by your health care provider. This is important. Contact a health care provider if: You are tired throughout the day. You have trouble in your daily routine due to sleepiness. You continue to have sleep problems, or your sleep problems get worse. Get help right away if: You have serious thoughts about hurting yourself or someone else. If you ever feel like you may hurt yourself or others, or have thoughts about taking your own life, get help right away. You can go to your nearest emergency department or call: Your local emergency services (911 in the [place] A suicide crisis helpline, such as the National Suicide Prevention Lifeline at 225-769-8859. This is open 24 hours a day. Summary Insomnia is a sleep disorder that makes it difficult to fall asleep or stay asleep. Insomnia can be long-term (chronic) or short-term (acute). Treatment for insomnia depends on the cause. Treatment may focus on treating an underlying condition that is causing insomnia. Keep a sleep diary to help you and your health care provider figure out what could be causing your insomnia. This information is not intended to  replace advice given to you by your health care provider. Make sure you discuss any questions you have with your healthcare provider. Document Revised: 02/10/2020 Document Reviewed: 02/10/2020 Elsevier Patient Education  2022 [employer].

## 2020-10-05 NOTE — Progress Notes (Signed)
New Patient Office Visit  Subjective:  Patient ID: Mary Sutton, female    DOB: Nov 16, 1995  Age: 25 y.o. MRN: [id number]  CC:  Chief Complaint  Patient presents with   Hematuria    HPI [facility] reports that she has been experieincing Dysuria; constant pressure; lower right back pain; brown discharge- sometimes clumps - ammonia type odor on occassion.  States this has been present for almost a month.  States that she was treated with Flagyl for bacterial vaginosis, states that she finished the treatment 2 days ago without any improvement.  States that she will also have stomach cramping  - comes with back pain - 3-4 times day -will last 10-15 minutes and resolves on its own.  Bowel movements are normal.  Is drinking approximately 3-4 bottles of water a day.  States that she was recently tested for STIs at the health department and felt reports everything was negative.  Reports that she has not had intercourse since then.  Reports that she is having difficulty sleeping, difficulty falling asleep and staying asleep, states that she is sleeping approximately 4 or 5 hours a day.  States that she does not like taking medication to help her with sleep, is not taking the Seroquel.  Endorses that she has been having night sweats almost every night for the past 2 months.    Sleep is bad - 4-5 hours a day  - night sweats past two months   Took flagyl - finished on Tuesday - no improvement   Reports that she feels her moods have improved since going to behavioral health urgent care, states that she has an appointment to start CBT in September but is also working to obtain counseling through her work  Adamantly denies any thoughts of self-harm.        Past Medical History:  Diagnosis Date   Chronic UTI    Headache    Word finding difficulty     History reviewed. No pertinent surgical history.  History reviewed. No pertinent family history.  Social History    Socioeconomic History   Marital status: Single    Spouse name: Not on file   Number of children: 0   Years of education: Not on file   Highest education level: Some college, no degree  Occupational History    Comment: customer service, work 2 jobs  Tobacco Use   Smoking status: Former    Pack years: 0.00    Types: Cigarettes    Quit date: 06/21/2018    Years since quitting: 2.2   Smokeless tobacco: Never   Tobacco comments:    1 cig a day, socially  Substance and Sexual Activity   Alcohol use: Yes    Comment: socially   Drug use: Yes    Types: Marijuana    Comment: 06/20/20 daily   Sexual activity: Not on file  Other Topics Concern   Not on file  Social History Narrative   Caffeine- none   Social Determinants of Health   Financial Resource Strain: Not on file  Food Insecurity: Not on file  Transportation Needs: Not on file  Physical Activity: Not on file  Stress: Not on file  Social Connections: Not on file  Intimate Partner Violence: Not on file    ROS Review of Systems  Constitutional:  Positive for fatigue. Negative for chills and fever.  HENT: Negative.    Eyes: Negative.   Respiratory:  Negative for shortness of breath.   Cardiovascular:  Negative for chest pain.  Gastrointestinal:  Positive for abdominal pain. Negative for constipation, diarrhea, nausea and vomiting.  Endocrine: Negative.   Genitourinary:  Positive for dysuria and vaginal discharge.  Musculoskeletal:  Positive for back pain.  Skin: Negative.   Allergic/Immunologic: Negative.   Neurological: Negative.   Hematological: Negative.   Psychiatric/Behavioral:  Positive for dysphoric mood and sleep disturbance. Negative for self-injury and suicidal ideas. The patient is nervous/anxious.    Objective:   Today's Vitals: BP 139/75 (BP Location: Left Arm, Patient Position: Sitting, Cuff Size: Normal)   Pulse 82   Temp 98.2 F (36.8 C) (Oral)   Resp 18   Ht 5\' 5"  (1.651 m)   Wt 177 lb (80.3  kg)   SpO2 100%   BMI 29.45 kg/m   Physical Exam Vitals and nursing note reviewed.  Constitutional:      Appearance: Normal appearance.  HENT:     Head: Normocephalic and atraumatic.     Right Ear: External ear normal.     Left Ear: External ear normal.     Nose: Nose normal.     Mouth/Throat:     Mouth: Mucous membranes are moist.     Pharynx: Oropharynx is clear.  Eyes:     Extraocular Movements: Extraocular movements intact.     Conjunctiva/sclera: Conjunctivae normal.     Pupils: Pupils are equal, round, and reactive to light.  Cardiovascular:     Rate and Rhythm: Normal rate and regular rhythm.     Pulses: Normal pulses.     Heart sounds: Normal heart sounds.  Pulmonary:     Effort: Pulmonary effort is normal.     Breath sounds: Normal breath sounds.  Abdominal:     Tenderness: There is no abdominal tenderness. There is no right CVA tenderness or left CVA tenderness.  Musculoskeletal:        General: Normal range of motion.     Cervical back: Normal range of motion and neck supple.  Skin:    General: Skin is warm and dry.  Neurological:     General: No focal deficit present.     Mental Status: She is alert and oriented to person, place, and time.  Psychiatric:        Mood and Affect: Mood normal.        Behavior: Behavior normal.        Thought Content: Thought content does not include homicidal or suicidal ideation.        Judgment: Judgment normal.    Assessment & Plan:   Problem List Items Addressed This Visit       Other   MDD (major depressive disorder), recurrent severe, without psychosis (HCC)   Relevant Orders   Vitamin D, 25-hydroxy (Completed)   B12 and Folate Panel (Completed)   Generalized anxiety disorder   Psychophysiological insomnia   Vaginal discharge - Primary   Relevant Orders   POCT URINALYSIS DIP (CLINITEK) (Completed)   Cervicovaginal ancillary only (Completed)    Outpatient Encounter Medications as of 10/05/2020  Medication  Sig   metroNIDAZOLE (FLAGYL) 500 MG tablet Take 500 mg by mouth 2 (two) times daily.   [DISCONTINUED] QUEtiapine (SEROQUEL) 25 MG tablet Take 1 tablet (25 mg total) by mouth at bedtime.   No facility-administered encounter medications on file as of 10/05/2020.   1. Vaginal discharge UA negative for urinary tract infection, patient just completed course of metronidazole 2 days ago.  Encouraged patient to increase hydration. - POCT URINALYSIS DIP (CLINITEK) -  Cervicovaginal ancillary only  2. Psychophysiological insomnia Patient declines medication.  Encourage patient to improve sleep hygiene, trial melatonin.  3. MDD (major depressive disorder), recurrent severe, without psychosis (HCC) Patient is working on counseling services through her employer.  Patient given application for [place] financial assistance, appointment to establish care Primary Care at [facility].  Red flags given for prompt reevaluation. - Vitamin D, 25-hydroxy - B12 and Folate Panel  4. Generalized anxiety disorder    I have reviewed the patient's medical history (PMH, PSH, Social History, Family History, Medications, and allergies) , and have been updated if relevant. I spent 32 minutes reviewing chart and  face to face time with patient.     Follow-up: Return in about 8 weeks (around 11/29/2020) for with Ricky Stabs, NP at Primary Care at [facility].   Kasandra Knudsen Mayers, PA-C

## 2020-10-06 LAB — CERVICOVAGINAL ANCILLARY ONLY
Bacterial Vaginitis (gardnerella): NEGATIVE
Candida Glabrata: NEGATIVE
Candida Vaginitis: NEGATIVE
Chlamydia: NEGATIVE
Comment: NEGATIVE
Comment: NEGATIVE
Comment: NEGATIVE
Comment: NEGATIVE
Comment: NEGATIVE
Comment: NORMAL
Neisseria Gonorrhea: NEGATIVE
Trichomonas: NEGATIVE

## 2020-10-06 LAB — B12 AND FOLATE PANEL
Folate: 10.5 ng/mL (ref >3.0)
Vitamin B-12: 479 pg/mL (ref 232–1245)

## 2020-10-06 LAB — VITAMIN D 25 HYDROXY (VIT D DEFICIENCY, FRACTURES): Vit D, 25-Hydroxy: 17.7 ng/mL — ABNORMAL LOW (ref 30.0–100.0)

## 2020-10-07 DIAGNOSIS — N898 Other specified noninflammatory disorders of vagina: Secondary | ICD-10-CM | POA: Insufficient documentation

## 2020-10-07 DIAGNOSIS — F5104 Psychophysiologic insomnia: Secondary | ICD-10-CM | POA: Insufficient documentation

## 2020-10-09 ENCOUNTER — Telehealth: Payer: Self-pay | Admitting: *Deleted

## 2020-10-09 ENCOUNTER — Ambulatory Visit: Payer: Self-pay | Admitting: Diagnostic Neuroimaging

## 2020-10-09 MED ORDER — VITAMIN D (ERGOCALCIFEROL) 1.25 MG (50000 UNIT) PO CAPS: 50000.0000 [IU] | ORAL_CAPSULE | ORAL | 2 refills | Status: DC

## 2020-10-09 NOTE — Addendum Note (Signed)
Addended by: Roney Jaffe on: 10/09/2020 11:00 AM   Modules accepted: Orders

## 2020-10-09 NOTE — Telephone Encounter (Signed)
Patient verified DOB Patient was able to view results on mychart with no concerns.  Patient will visit [facility] in the next few days to speak about new diagnosis of HPV from the health dept.

## 2020-10-09 NOTE — Telephone Encounter (Signed)
-----   Message from Roney Jaffe, [place] sent at 10/09/2020 11:00 AM EDT ----- Please call patient and let her know that her B12 and folate levels are within normal limits.  Her vaginal swab was negative for trichomonas, gonorrhea, chlamydia, bacterial vaginitis or a yeast infection.  Her vitamin D is low, she needs to take 50,000 units once a week for the next 12 weeks.  Prescription sent to her pharmacy.

## 2020-10-10 ENCOUNTER — Encounter: Payer: Self-pay | Admitting: Physician Assistant

## 2020-10-10 ENCOUNTER — Other Ambulatory Visit: Payer: Self-pay

## 2020-10-10 ENCOUNTER — Telehealth: Payer: Self-pay | Admitting: *Deleted

## 2020-10-10 DIAGNOSIS — F411 Generalized anxiety disorder: Secondary | ICD-10-CM

## 2020-10-10 DIAGNOSIS — N898 Other specified noninflammatory disorders of vagina: Secondary | ICD-10-CM

## 2020-10-10 DIAGNOSIS — F5105 Insomnia due to other mental disorder: Secondary | ICD-10-CM

## 2020-10-10 DIAGNOSIS — F329 Major depressive disorder, single episode, unspecified: Secondary | ICD-10-CM

## 2020-10-10 NOTE — Progress Notes (Signed)
Patient received a recent diagnosis of HPV from her visit to the health dept. Patient would like to know any treatment or next steps regarding the results.

## 2020-10-11 NOTE — Patient Instructions (Signed)
Patient is aware of needing to have a recheck completed after 25 with the Health Department to determine next steps if HPV is still present.

## 2020-10-23 ENCOUNTER — Ambulatory Visit: Payer: Self-pay | Admitting: Diagnostic Neuroimaging

## 2020-10-27 ENCOUNTER — Ambulatory Visit: Payer: Self-pay | Attending: Family Medicine

## 2020-10-27 ENCOUNTER — Other Ambulatory Visit: Payer: Self-pay

## 2020-11-01 ENCOUNTER — Encounter: Payer: Self-pay | Admitting: Physician Assistant

## 2020-11-21 ENCOUNTER — Other Ambulatory Visit: Payer: Self-pay

## 2020-11-21 ENCOUNTER — Ambulatory Visit (INDEPENDENT_AMBULATORY_CARE_PROVIDER_SITE_OTHER): Payer: No Payment, Other | Admitting: Clinical

## 2020-11-21 DIAGNOSIS — F332 Major depressive disorder, recurrent severe without psychotic features: Secondary | ICD-10-CM

## 2020-11-21 NOTE — Progress Notes (Signed)
   THERAPIST PROGRESS NOTE  Session Time: 45 minutes  Participation Level: Active  Behavioral Response: CasualAlertDepressed  Type of Therapy: Individual Therapy  Treatment Goals addressed: Coping  Interventions: CBT and Supportive  Summary:  Mary Sutton is a 24 y.o. female who presents for the appointment oriented x5, appropriately dressed, and friendly.  Client denies hallucinations and delusions. Client reported on today she has been feeling about the same since she was last seen.  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  Client reported she enjoys her job.  Client reported she continues to have depressed mood.  Client stated "I cannot fathom thoughts right now".  Client reported she has tried to process things about her childhood that affect her now but she does not follow through.  Client reported she has been journaling but it does not help because it seems to be redundant with what she knows the issues are.  Client reported in the past she has brought her concerns to her parents about things that have occurred in her childhood and the response was "somewhat".  Client reported she wants to have her own motivation to do things but from what being stilled in her and makes her feel like nonfunctional.  Client reported she does not know if at this point in her life and apology from her parents that made things better.  Client reported though she has had thoughts that she wishes her parents would treat her like she is their child.  Client reported during her upbringing her parents made her feel like she had to be alone and handling things on her own, feeling complacent, reinforced the believe her failure, and negative body image issues.  Client reported she overall has the believe that people are not good and/or will treat you right.  Client reported by the end of September she plans to move out of state with people that she knows for period of time and then will  begin.    Suicidal/Homicidal: Nowithout intent/plan  Therapist Response:  Therapist began the session asking the client how she has been doing since last seen. Therapist used CBT to utilize active listening and positive emotional support towards the client's thoughts and feelings. Therapist used CBT to engage with the client to have her identify her taught negative believes and triggers that impact her current daily functioning decision making. Therapist used CBT to engage with the client to challenge her negative thinking compared to her desired outcomes for herself. Therapist assigned the client homework for her to read the provided psychoeducational materials about unhealthy thinking styles and challenging negative beliefs. Client was scheduled for next appointment.     Plan: Return again in 5 weeks.  Diagnosis: Major depressive disorder, recurrent episode, severe, without psychotic features   Neena Rhymes Damire Remedios, LCSW 11/21/2020

## 2020-11-27 NOTE — Progress Notes (Signed)
Subjective:    Mary Sutton - 25 y.o. female MRN [id number]  Date of birth: 1995/08/18  HPI  [facility] is to establish care.   Current issues and/or concerns: Concern for body odor since 25 years-old. In the past saw several primary care doctors without clear explanation of what may be causing the same. Has changed diet and tried over-the-counter and prescribed deodorants without success. Endorses history of night sweats. Recently noticed when she finished her vitamin D prescription night sweats returned. Patient not ready for referral to specialists as of present related to health insurance concerns. Reports she was not approved for [facility] Financial Assistance/orange card.   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 Her smoking use included cigarettes. She has never used smokeless tobacco. She reports current alcohol use. She reports current drug use. Drug: Marijuana.   Family History  family history is not on file.   Medications: reviewed and updated   Objective:   Physical Exam BP 114/75 (BP Location: Left Arm, Patient Position: Sitting, Cuff Size: Large)   Pulse 91   Temp 98.3 F (36.8 C)   Resp 18   Ht 5\' 5"  (1.651 m)   Wt 170 lb 9.6 oz (77.4 kg)   SpO2 99%   BMI 28.39  kg/m   Physical Exam HENT:     Head: Normocephalic and atraumatic.  Eyes:     Extraocular Movements: Extraocular movements intact.     Conjunctiva/sclera: Conjunctivae normal.     Pupils: Pupils are equal, round, and reactive to light.  Cardiovascular:     Rate and Rhythm: Normal rate and regular rhythm.     Pulses: Normal pulses.     Heart sounds: Normal heart sounds.  Pulmonary:     Effort: Pulmonary effort is normal.     Breath sounds: Normal breath sounds.  Neurological:     Mental Status: She is alert.      Assessment & Plan:  1. Encounter to establish care: - Patient presents today to establish care.  - Return for annual physical examination, labs, and health maintenance. Arrive fasting meaning having no food for at least 8 hours prior to appointment. You may have only water or black coffee. Please take scheduled medications as normal.  2. Body odor: - Ongoing since patient was 25 years-old.  - FSH and LH to screen for hormone levels.  - Patient would like to hold referrals to specialists as of present related to health insurance concerns. Also, she was recently denied [facility] Financial Assistance/orange card.  - Follow-up with primary provider as scheduled.  - Follicle stimulating hormone; Future - Luteinizing hormone; Future  3. Vitamin D deficiency: - Screening for deficiency. - Vitamin D, 25-hydroxy    Patient was  given clear instructions to go to Emergency Department or return to medical center if symptoms don't improve, worsen, or new problems develop.The patient verbalized understanding.  I discussed the assessment and treatment plan with the patient. The patient was provided an opportunity to ask questions and all were answered. The patient agreed with the plan and demonstrated an understanding of the instructions.   The patient was advised to call back or seek an in-person evaluation if the symptoms worsen or if the condition fails to improve as  anticipated.    Ricky Stabs, NP 11/29/2020, 11:08 PM Primary Care at [facility]

## 2020-11-29 ENCOUNTER — Ambulatory Visit (INDEPENDENT_AMBULATORY_CARE_PROVIDER_SITE_OTHER): Payer: Self-pay | Admitting: Family

## 2020-11-29 ENCOUNTER — Other Ambulatory Visit: Payer: Self-pay

## 2020-11-29 ENCOUNTER — Encounter: Payer: Self-pay | Admitting: Family

## 2020-11-29 VITALS — BP 114/75 | HR 91 | Temp 98.3°F | Resp 18 | Ht 65.0 in | Wt 170.6 lb

## 2020-11-29 DIAGNOSIS — L750 Bromhidrosis: Secondary | ICD-10-CM

## 2020-11-29 DIAGNOSIS — Z7689 Persons encountering health services in other specified circumstances: Secondary | ICD-10-CM

## 2020-11-29 DIAGNOSIS — E559 Vitamin D deficiency, unspecified: Secondary | ICD-10-CM

## 2020-11-29 NOTE — Progress Notes (Signed)
Pt presents to establish care pt reports sweaty skin body odor for approx. 13 years

## 2020-11-29 NOTE — Patient Instructions (Signed)
- Return for annual physical examination, labs, and health maintenance. Arrive fasting meaning having no food for at least 8 hours prior to appointment. You may have only water or black coffee. Please take scheduled medications as normal. Thank you for choosing Primary Care at [facility] for your medical home!    [facility] was seen by Rema Fendt, NP today.   [facility] Miao's primary care provider is Lisbeth Puller Jodi Geralds, NP.    For the best care possible,  you should try to see Ricky Stabs, NP.  whenever you come to clinic.   We look forward to seeing you again soon!  If you have any questions about your visit today,  please call us at [phone]  Or feel free to reach your provider via MyChart.     Keeping you healthy   Get these tests Blood pressure- Have your blood pressure checked once a year by your healthcare provider.  Normal blood pressure is 120/80. Weight- Have your body mass index (BMI) calculated to screen for obesity.  BMI is a measure of body fat based on height and weight. You can also calculate your own BMI at https://www.west-esparza.com/. Cholesterol- Have your cholesterol checked regularly starting at age 48, sooner may be necessary if you have diabetes, high blood pressure, if a family member developed heart diseases at an early age or if you smoke.  Chlamydia, HIV, and other sexual transmitted disease- Get screened each year until the age of 23 then within three months of each new sexual partner. Diabetes- Have your blood sugar checked regularly if you have high blood pressure, high cholesterol, a family history of diabetes or if you are overweight.   Get these vaccines Flu shot- Every fall. Tetanus shot- Every 10 years. Menactra- Single dose; prevents meningitis.   Take these steps Don't smoke- If you do smoke, ask your healthcare provider about quitting. For tips on how to quit, go to www.smokefree.gov or call 1-800-QUIT-NOW. Be physically active-  Exercise 5 days a week for at least 30 minutes.  If you are not already physically active start slow and gradually work up to 30 minutes of moderate physical activity.  Examples of moderate activity include walking briskly, mowing the yard, dancing, swimming bicycling, etc. Eat a healthy diet- Eat a variety of healthy foods such as fruits, vegetables, low fat milk, low fat cheese, yogurt, lean meats, poultry, fish, beans, tofu, etc.  For more information on healthy eating, go to www.thenutritionsource.org Drink alcohol in moderation- Limit alcohol intake two drinks or less a day.  Never drink and drive. Dentist- Brush and floss teeth twice daily; visit your dentis twice a year. Depression-Your emotional health is as important as your physical health.  If you're feeling down, losing interest in things you normally enjoy please talk with your healthcare provider. Gun Safety- If you keep a gun in your home, keep it unloaded and with the safety lock on.  Bullets should be stored separately. Helmet use- Always wear a helmet when riding a motorcycle, bicycle, rollerblading or skateboarding. Safe sex- If you may be exposed to a sexually transmitted infection, use a condom Seat belts- Seat bels can save your life; always wear one. Smoke/Carbon Monoxide detectors- These detectors need to be installed on the appropriate level of your home.  Replace batteries at least once a year. Skin Cancer- When out in the sun, cover up and use sunscreen SPF 15 or higher. Violence- If anyone is threatening or hurting you, please tell  your healthcare provider.

## 2020-11-30 ENCOUNTER — Other Ambulatory Visit: Payer: Self-pay | Admitting: Family

## 2020-11-30 DIAGNOSIS — E559 Vitamin D deficiency, unspecified: Secondary | ICD-10-CM

## 2020-11-30 LAB — VITAMIN D 25 HYDROXY (VIT D DEFICIENCY, FRACTURES): Vit D, 25-Hydroxy: 24.8 ng/mL — ABNORMAL LOW (ref 30.0–100.0)

## 2020-11-30 MED ORDER — VITAMIN D (ERGOCALCIFEROL) 1.25 MG (50000 UNIT) PO CAPS: 50000.0000 [IU] | ORAL_CAPSULE | ORAL | 0 refills | Status: AC

## 2020-11-30 NOTE — Progress Notes (Signed)
Vitamin D remaining lower than normal. However, improved since 4 weeks ago. Continue Vitamin D as prescribed.

## 2020-11-30 NOTE — Progress Notes (Signed)
  Labs results and result note from provider reviewed by patient through Mychart  

## 2020-12-06 ENCOUNTER — Ambulatory Visit (INDEPENDENT_AMBULATORY_CARE_PROVIDER_SITE_OTHER): Payer: No Payment, Other | Admitting: Clinical

## 2020-12-06 ENCOUNTER — Other Ambulatory Visit: Payer: Self-pay

## 2020-12-06 DIAGNOSIS — F332 Major depressive disorder, recurrent severe without psychotic features: Secondary | ICD-10-CM

## 2020-12-06 NOTE — Progress Notes (Signed)
   THERAPIST PROGRESS NOTE  Session Time: 35 minutes  Participation Level: Active  Behavioral Response: CasualAlertDepressed  Type of Therapy: Individual Therapy  Treatment Goals addressed: Coping  Interventions: CBT and Supportive  Summary:  Mary Sutton is a 25 y.o. female who presents for the scheduled session oriented x5, appropriately dressed, and friendly.  Client denied hallucinations and delusions. Client reported on today she has been maintaining fairly well.  Client reported since the last appointment she has been reading a BH psychoeducational worksheet provided to her about how to process her thoughts and emotions.  Client reported it has been helpful.  Client reported she has some friends willing to give her a better dog who was exposed a little because they can no longer keep it.  Client reported she would like a letter stating it is a therapeutic animal so she can keep it in her apartment.  Client reported otherwise her close friend whom she has been sharing an apartment with over the past year has turned into a negative living environment.  Client reported she and the friend do not see eye to eye on how to maintain the common living area.  Client reported she does not like conflict but will try to deal with things until she moves out in a few weeks.  Client reported she will be staying with a friend in [place] for a few days and then going to [place] for a few days.  Client reported she does not have a vision for what she would like to do with work if she is unable to keep her current remote job.  Client reported she has struggled with consistency because if she cannot do it right the first time think she will not continue to pursue something.  Client reported she notes this comes from how her parents instilled negative believes in her.    Suicidal/Homicidal: Nowithout intent/plan  Therapist Response:  Therapist began by asking the client how she has been doing since last  seen. Therapist used CBT to utilize active listening and positive emotional support. Therapist used CBT to ask client open-ended questions about the severity of her current mental health symptoms. Therapist used CBT to ask the client about the usefulness of the psychoeducational worksheet given to her at the prior session. Therapist used CBT to engage with the client to discuss positive coping skills to use in moments of depression and stress. Therapist assigned client homework to practice self-care and reframing negative thoughts.    Plan:  Client will attend walk-in hours for therapy as needed until she moves out of state within the next 3 weeks.  Client was instructed to research outpatient therapy options for where she will be living.  Therapist made the client aware of crisis services within the county that she can use if needed.   Diagnosis: MDD, recurrent, severe, without psychosis  Mary Rhymes Rieley Khalsa, LCSW 12/06/2020

## 2021-01-01 ENCOUNTER — Ambulatory Visit: Payer: Medicaid Other | Admitting: Nurse Practitioner

## 2021-01-22 ENCOUNTER — Ambulatory Visit (HOSPITAL_COMMUNITY): Payer: No Typology Code available for payment source | Admitting: Clinical

## 2021-08-15 IMAGING — CT CT HEAD W/O CM
4 series · 16 of 47 positions shown, 18 images · non-contrast
Comparison: None.

CLINICAL DATA: Headache, new or worsening.

EXAM:
CT HEAD WITHOUT CONTRAST
TECHNIQUE: Contiguous axial images were obtained from the base of the skull
through the vertex without intravenous contrast.

[Series 3: head without · axial · non-contrast · 0.46mm/px · z∈[+1305,+1425]mm · 7 of 34 slices shown, 9 images]
[im 5/34  brain]
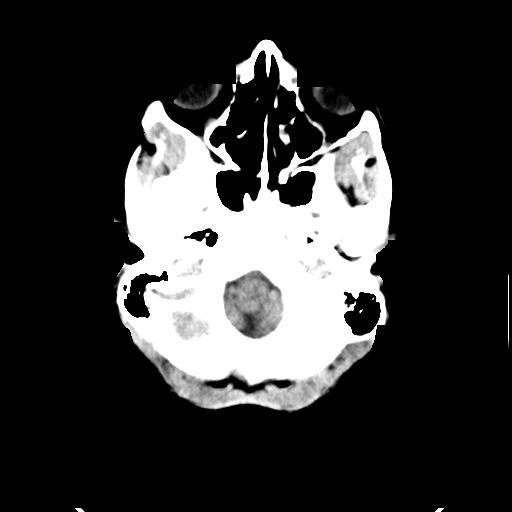
[im 5/34  bone]
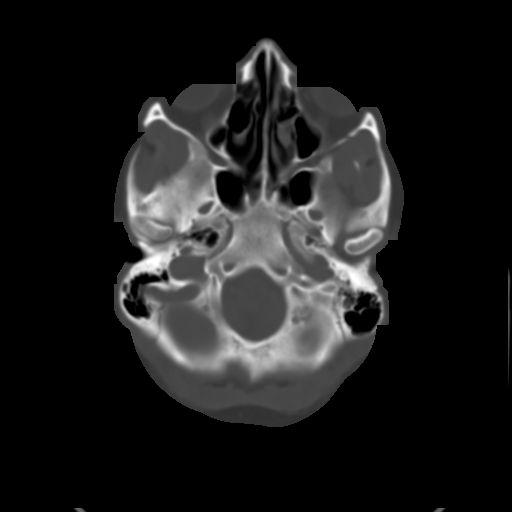
[im 9/34  brain]
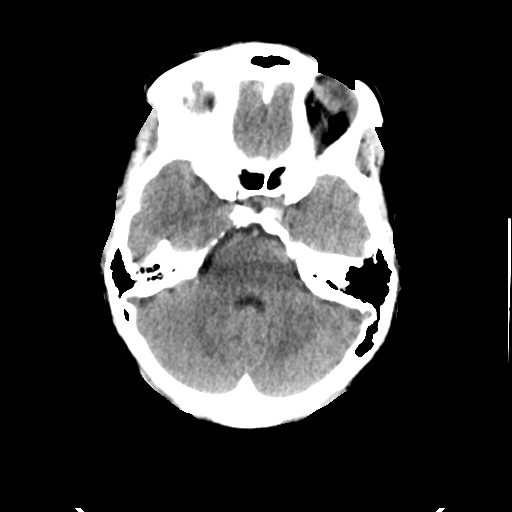
[im 13/34  brain]
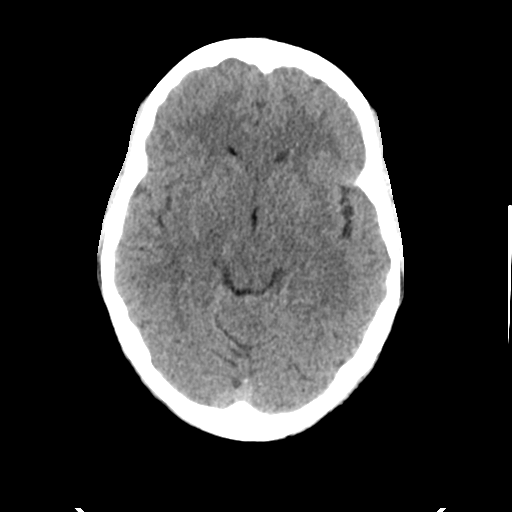
[im 17/34  brain]
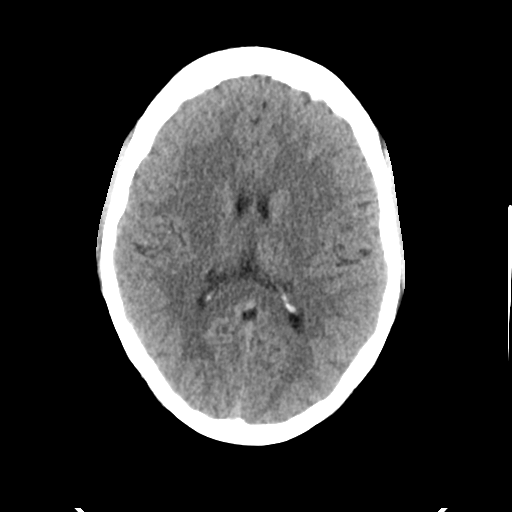
[im 21/34  brain]
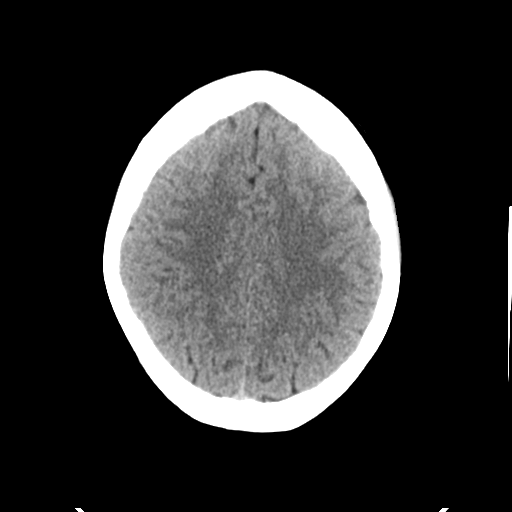
[im 21/34  bone]
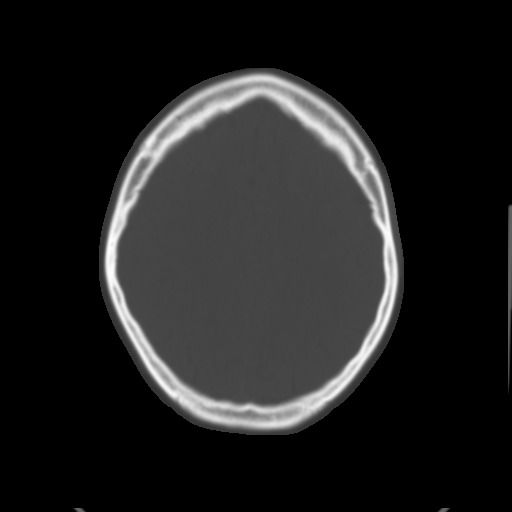
[im 25/34  brain]
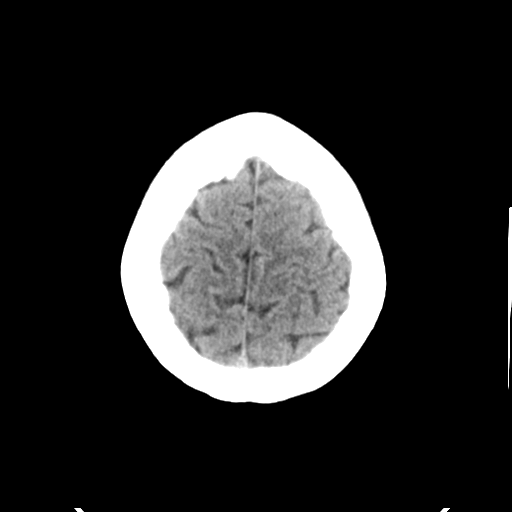
[im 29/34  brain]
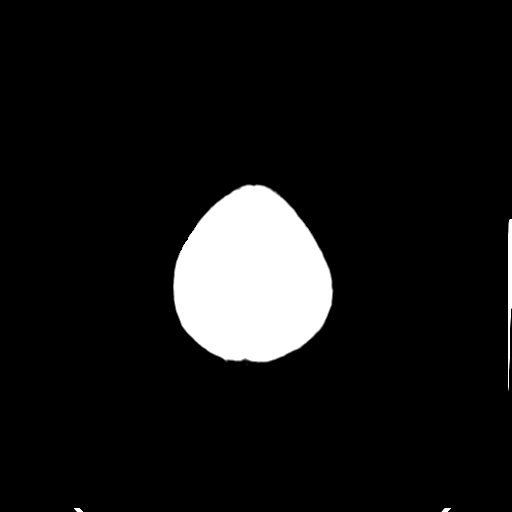

[Series 4: head bone · axial · 0.46mm/px · z∈[+1301,+1333]mm · 3 of 83 slices shown]
[im 9/83  bone]
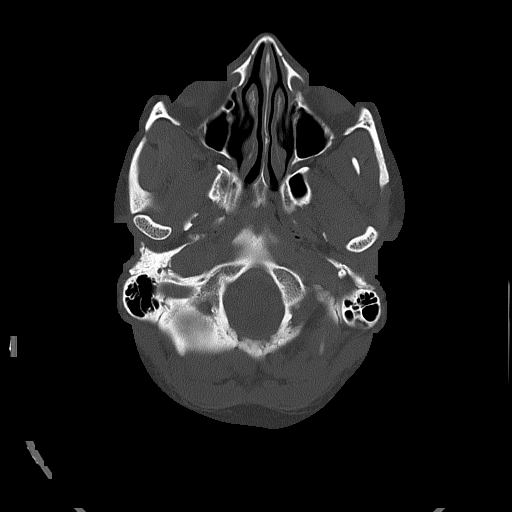
[im 17/83  bone]
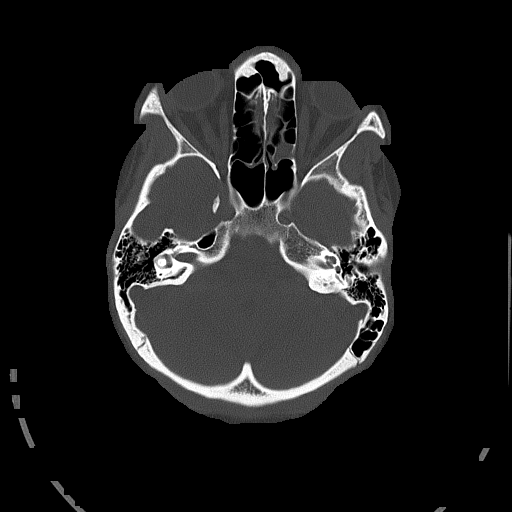
[im 25/83  bone]
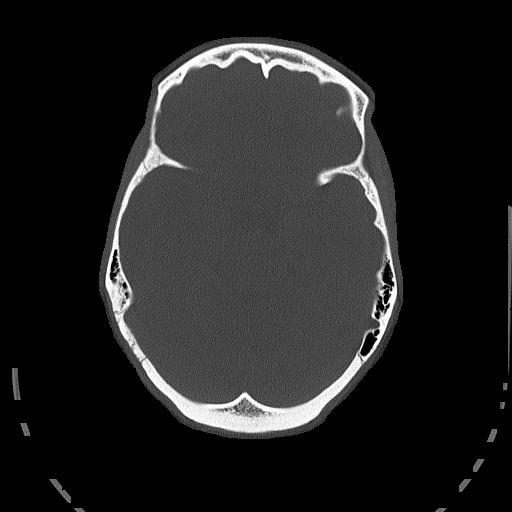

[Series 5: head without cor · coronal · non-contrast · 0.32mm/px · 3 of 70 slices shown]
[im 24/70  brain]
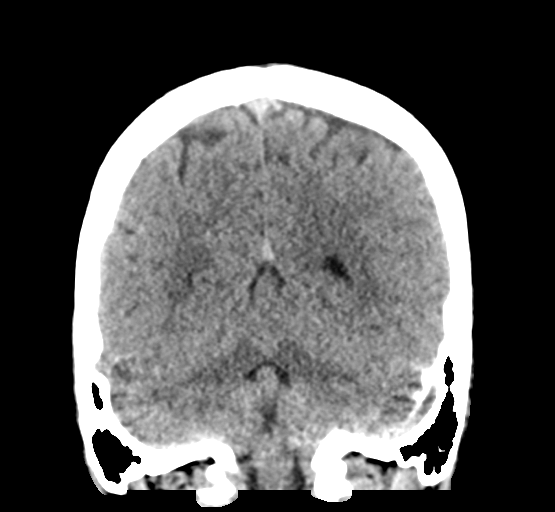
[im 31/70  brain]
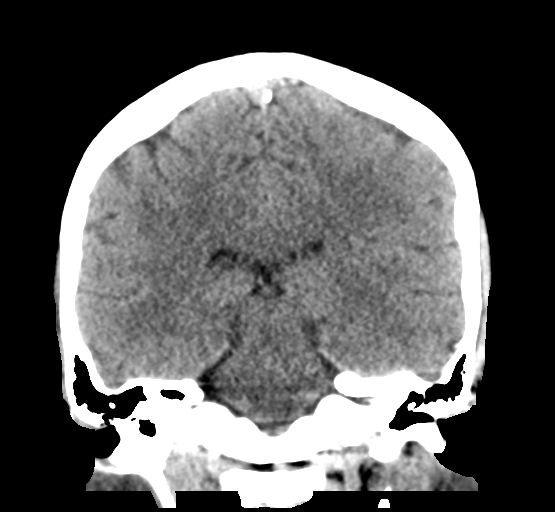
[im 39/70  brain]
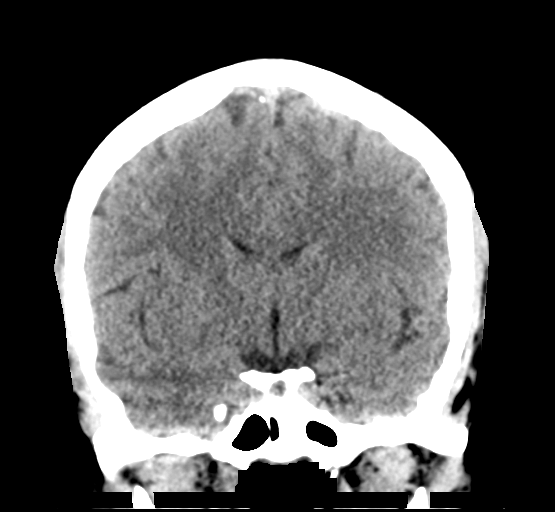

[Series 6: head without sag · sagittal · non-contrast · 0.33mm/px · 3 of 67 slices shown]
[im 23/67  brain]
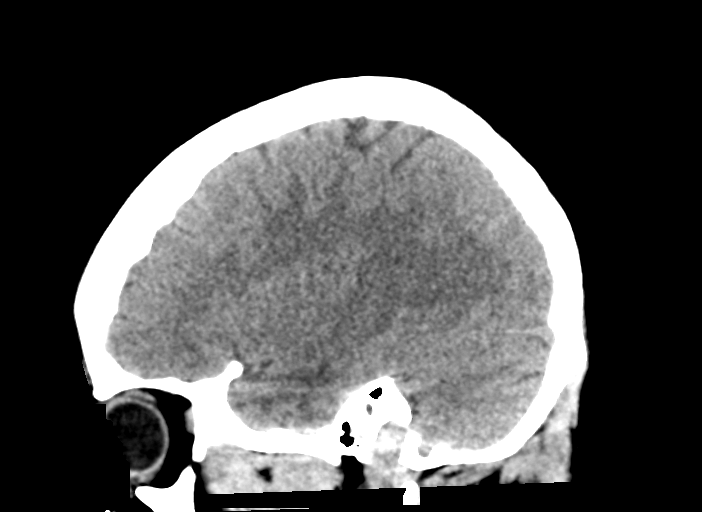
[im 34/67  brain]
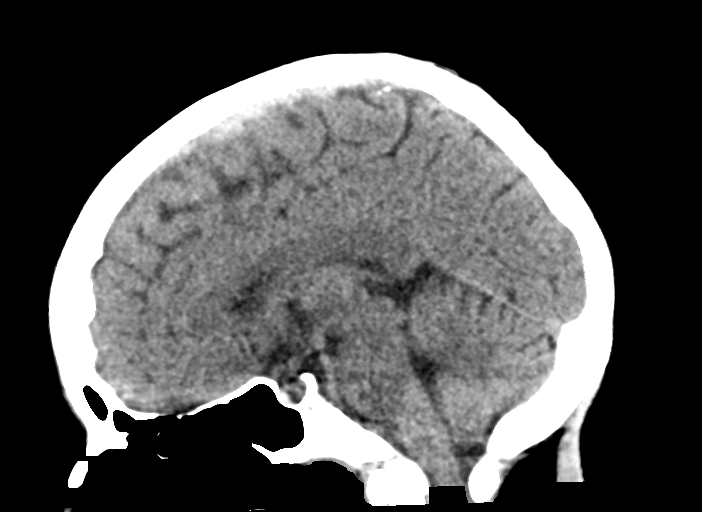
[im 45/67  brain]
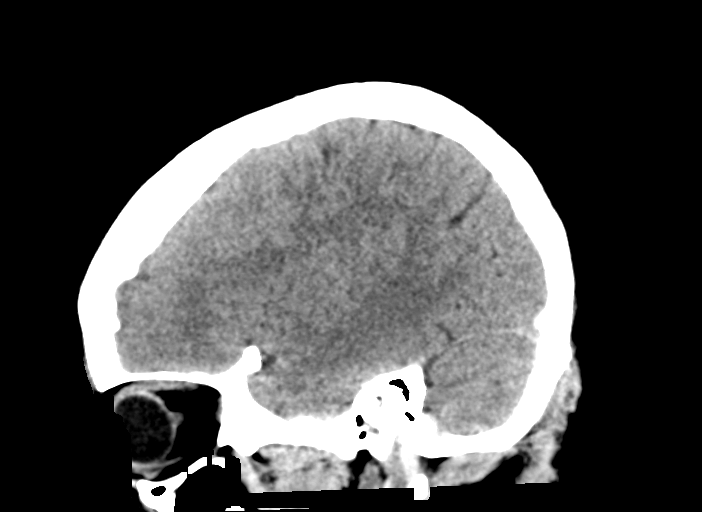

[16 of 47 positions shown; findings below may reference images not displayed]

FINDINGS: Brain: No evidence of acute large vascular territory infarction,
hemorrhage, hydrocephalus, extra-axial collection or mass
lesion/mass effect.

Vascular: No hyperdense vessel or unexpected calcification.

Skull: Normal. Negative for fracture or focal lesion.

Sinuses/Orbits: Opacification of a few left ethmoid air cells.
Unremarkable orbits.

Other: No mastoid effusions.
IMPRESSION: No evidence of acute intracranial abnormality.
